# Patient Record
Sex: Male | Born: 1975 | Hispanic: Yes | Marital: Single | Smoking: Former smoker
Health system: Southern US, Community
[De-identification: ages and names within clinical notes are randomized; demographics above are authoritative.]

## PROBLEM LIST (undated history)

## (undated) DIAGNOSIS — F909 Attention-deficit hyperactivity disorder, unspecified type: Secondary | ICD-10-CM

## (undated) DIAGNOSIS — T148XXA Other injury of unspecified body region, initial encounter: Secondary | ICD-10-CM

## (undated) HISTORY — PX: ANTERIOR CRUCIATE LIGAMENT REPAIR: SHX115

---

## 1998-03-11 ENCOUNTER — Emergency Department (HOSPITAL_COMMUNITY): Admission: EM | Admit: 1998-03-11 | Discharge: 1998-03-11 | Payer: Self-pay | Admitting: Emergency Medicine

## 2012-01-09 ENCOUNTER — Emergency Department (HOSPITAL_COMMUNITY): Payer: Self-pay

## 2012-01-09 ENCOUNTER — Emergency Department (HOSPITAL_COMMUNITY)
Admission: EM | Admit: 2012-01-09 | Discharge: 2012-01-09 | Disposition: A | Discharge status: Home / Self Care | Payer: Self-pay | Attending: Emergency Medicine | Admitting: Emergency Medicine

## 2012-01-09 ENCOUNTER — Encounter (HOSPITAL_COMMUNITY): Payer: Self-pay | Admitting: Emergency Medicine

## 2012-01-09 DIAGNOSIS — Z23 Encounter for immunization: Secondary | ICD-10-CM | POA: Insufficient documentation

## 2012-01-09 DIAGNOSIS — S51809A Unspecified open wound of unspecified forearm, initial encounter: Secondary | ICD-10-CM | POA: Insufficient documentation

## 2012-01-09 DIAGNOSIS — W298XXA Contact with other powered powered hand tools and household machinery, initial encounter: Secondary | ICD-10-CM | POA: Insufficient documentation

## 2012-01-09 DIAGNOSIS — S51819A Laceration without foreign body of unspecified forearm, initial encounter: Secondary | ICD-10-CM

## 2012-01-09 DIAGNOSIS — Z1889 Other specified retained foreign body fragments: Secondary | ICD-10-CM | POA: Insufficient documentation

## 2012-01-09 MED ORDER — HYDROCODONE-ACETAMINOPHEN 5-325 MG PO TABS: ORAL_TABLET | ORAL | Status: DC

## 2012-01-09 MED ORDER — TETANUS-DIPHTH-ACELL PERTUSSIS 5-2.5-18.5 LF-MCG/0.5 IM SUSP
0.5000 mL | Freq: Once | INTRAMUSCULAR | Status: AC
  Administered 2012-01-09: 0.5 mL via INTRAMUSCULAR
  Filled 2012-01-09: qty 0.5

## 2012-01-09 NOTE — ED Provider Notes (Signed)
History     CSN: [id number]  Arrival date & time 01/09/12  1018   First MD Initiated Contact with Patient 01/09/12 1025      Chief Complaint  Patient presents with  . Extremity Laceration    (Consider location/radiation/quality/duration/timing/severity/associated sxs/prior treatment) HPI Comments: Patient presents with complaint of left forearm laceration sustained while he was working with a chain saw. Patient was cutting a limb off a tree limb when the chain saw struck his arm. Pressure was applied prior to arrival. No other treatments prior to arrival. Patient denies numbness, weakness, tingling of his left arm or hand. Onset was acute. Course is constant. Nothing makes symptoms better or worse. Tetanus is out of date.  Patient is a 36 y.o. male presenting with skin laceration. The history is provided by the patient.  Laceration     No past medical history on file.  No past surgical history on file.  No family history on file.  History  Substance Use Topics  . Smoking status: Not on file  . Smokeless tobacco: Not on file  . Alcohol Use: Not on file      Review of Systems  Constitutional: Negative for fever.  HENT: Negative for neck pain and neck stiffness.   Eyes: Negative for redness.  Respiratory: Negative for shortness of breath.   Cardiovascular: Negative for chest pain.  Gastrointestinal: Negative for nausea, vomiting, abdominal pain and diarrhea.  Musculoskeletal: Positive for myalgias.  Skin: Positive for wound. Negative for rash.  Neurological: Negative for headaches.  Psychiatric/Behavioral: Negative for self-injury.    Allergies  Review of patient's allergies indicates no known allergies.  Home Medications   Current Outpatient Rx  Name Route Sig Dispense Refill  . HYDROCODONE-ACETAMINOPHEN 5-325 MG PO TABS  Take 1-2 tablets every 6 hours as needed for severe pain 12 tablet 0    BP 115/64  Pulse 68  Temp 98.4 F (36.9 C) (Oral)  Resp 16   SpO2 97%  Physical Exam  Nursing note and vitals reviewed. Constitutional: He appears well-developed and well-nourished.  HENT:  Head: Normocephalic and atraumatic.  Eyes: Conjunctivae normal are normal. Right eye exhibits no discharge. Left eye exhibits no discharge.  Neck: Normal range of motion. Neck supple.  Cardiovascular: Normal rate, regular rhythm and normal heart sounds.   Pulses:      Radial pulses are 2+ on the right side, and 2+ on the left side.  Pulmonary/Chest: Effort normal and breath sounds normal.  Abdominal: Soft. There is no tenderness.  Musculoskeletal: He exhibits tenderness.       Capillary refill less than 2 seconds in all fingers of left hand. Patient has full range of motion in left fingers, hand, wrist, elbow, and shoulder.  Neurological: He is alert.       Sensation intact distal to the wound.  Skin: Skin is warm and dry.       Two parallel linear full-thickness wounds of left dorsum of forearm extending into the muscle at places. The more proximal wound is 7 cm in length and the distal wound is 5 cm in length. Small amount of debris noted but wound is relatively clean.  Psychiatric: He has a normal mood and affect.    ED Course  Procedures (including critical care time)  Labs Reviewed - No data to display Dg Forearm Left  01/09/2012  *RADIOLOGY REPORT*  Clinical Data: Left forearm laceration by chain saw  LEFT FOREARM - 2 VIEW  Comparison: None  Findings: Soft tissue  irregularity along the radial margin of the mid left forearm. Osseous mineralization normal. Joint spaces preserved. Nonfused ossicle at tip of the ulnar styloid process. Small olecranon spur. No acute fracture, dislocation, or bone destruction.  IMPRESSION: No acute osseous abnormalities.   Original Report Authenticated By: Lollie Marrow, M.D.      1. Laceration of forearm     10:43 AM Patient seen and examined. Blood pressure cuff used as tourniquet to control bleeding. I deflated it to  and identifed some venous oozing. Lacerations anesthetized with 10cc 2% lidocaine with epinephrine. Deep horizontal mattress stitch was made using 4-0 Vicryl to obtain hemostasis. BP cuff released. 2+ radial pulse. Patient with full ROM in all fingers, hand, wrist, elbow. X-ray, tetanus ordered. Will need to clean wound and repair.   Vital signs reviewed and are as follows: Filed Vitals:   01/09/12 1044  BP: 115/64  Pulse: 68  Temp: 98.4 F (36.9 C)  Resp: 16   11:43 AM X-ray results reviewed.   Patient informed of x-ray results.  Patient rinsed the wound under warm tap water for 5 minutes. The wound was then further cleaned with approximately 500 cc of normal saline under pressure with syringe and splash guard. Patient tolerated this well. No further debris visualized inside the wound. Wound fully explored. Base fully visualized. No significant tendon, muscle, or nerve injury visualized.  LACERATION REPAIR Performed by: Carolee Rota Authorized by: Carolee Rota Consent: Verbal consent obtained. Risks and benefits: risks, benefits and alternatives were discussed Consent given by: patient Patient identity confirmed: provided demographic data Prepped and Draped in normal sterile fashion Wound explored  Laceration Location: L forearm  Laceration Length: 4cm  No Foreign Bodies seen or palpated  Anesthesia: local infiltration  Local anesthetic: lidocaine 2% with epinephrine  Anesthetic total: 4 ml  Irrigation method: bulk flow, syringe with splash guard  Amount of cleaning: copious  Skin closure: 4-0 Ethilon  Number of sutures: 8  Technique: locked running  Patient tolerance: Patient tolerated the procedure well with no immediate complications.   LACERATION REPAIR Performed by: Carolee Rota Authorized by: Carolee Rota Consent: Verbal consent obtained. Risks and benefits: risks, benefits and alternatives were discussed Consent given by:  patient Patient identity confirmed: provided demographic data Prepped and Draped in normal sterile fashion Wound explored  Laceration Location: left forearm  Laceration Length: 6cm  No Foreign Bodies seen or palpated  Anesthesia: local infiltration  Local anesthetic: lidocaine 2% with epinephrine  Anesthetic total: 6 ml  Irrigation method: bulk flow with tap water, syringe with splash guard  Amount of cleaning: copious  Skin closure: 4-0 Ethilon  Number of sutures: 10  Technique: simple interrupted  Patient tolerance: Patient tolerated the procedure well with no immediate complications.  Patient counseled on wound care.   The patient was urged to return to the Emergency Department urgently with worsening pain, swelling, expanding erythema especially if it streaks away from the affected area, fever, or if they have any other concerns. Patient verbalized understanding.   Patient counseled on use of narcotic pain medications. Counseled not to combine these medications with others containing tylenol. Urged not to drink alcohol, drive, or perform any other activities that requires focus while taking these medications. The patient verbalizes understanding and agrees with the plan.  Patient counseled on need to return for suture removal in 10 days.   MDM  Patient was laceration of left forearm due to chainsaw. X-ray negative. Tetanus updated. Wound cleaned well with copious amounts  of water. Repaired without incident. Do not suspect significant neurovascular injury. Do not suspect tendon injury.        Renne Crigler, [place] 01/09/12 1749

## 2012-01-09 NOTE — ED Notes (Signed)
To ED via POV for eval after lacerating left forearm with chain saw. Actively bleeding on arrival. Brought straight to ED room and automatic tourniquet placed with desired results. ED staff and providers immediately to bedside.

## 2012-01-09 NOTE — ED Provider Notes (Addendum)
Patient suffered laceration to dorsum of left forearm as result of cutting wood with a chain saw.. Hand , wrist with full range of motion radial pulse 2+  Doug Sou, MD 01/09/12 1153  Doug Sou, MD 01/09/12 1153

## 2012-01-10 NOTE — ED Provider Notes (Signed)
Medical screening examination/treatment/procedure(s) were conducted as a shared visit with non-physician practitioner(s) and myself.  I personally evaluated the patient during the encounter  Doug Sou, MD 01/10/12 [phone]

## 2018-08-22 ENCOUNTER — Encounter (HOSPITAL_COMMUNITY): Payer: Self-pay | Admitting: Emergency Medicine

## 2018-08-22 ENCOUNTER — Emergency Department (HOSPITAL_COMMUNITY): Payer: No Typology Code available for payment source

## 2018-08-22 ENCOUNTER — Inpatient Hospital Stay (HOSPITAL_COMMUNITY)
Admission: EM | Admit: 2018-08-22 | Discharge: 2018-08-24 | DRG: 183 | Disposition: A | Discharge status: Home / Self Care | Payer: No Typology Code available for payment source | Attending: Surgery | Admitting: Surgery

## 2018-08-22 ENCOUNTER — Other Ambulatory Visit: Payer: Self-pay

## 2018-08-22 DIAGNOSIS — Z1159 Encounter for screening for other viral diseases: Secondary | ICD-10-CM

## 2018-08-22 DIAGNOSIS — S2241XA Multiple fractures of ribs, right side, initial encounter for closed fracture: Secondary | ICD-10-CM | POA: Diagnosis present

## 2018-08-22 DIAGNOSIS — S22019A Unspecified fracture of first thoracic vertebra, initial encounter for closed fracture: Secondary | ICD-10-CM | POA: Diagnosis present

## 2018-08-22 DIAGNOSIS — S27321A Contusion of lung, unilateral, initial encounter: Secondary | ICD-10-CM | POA: Diagnosis present

## 2018-08-22 DIAGNOSIS — S42111A Displaced fracture of body of scapula, right shoulder, initial encounter for closed fracture: Secondary | ICD-10-CM | POA: Diagnosis present

## 2018-08-22 DIAGNOSIS — S12600A Unspecified displaced fracture of seventh cervical vertebra, initial encounter for closed fracture: Secondary | ICD-10-CM | POA: Diagnosis present

## 2018-08-22 DIAGNOSIS — S272XXA Traumatic hemopneumothorax, initial encounter: Secondary | ICD-10-CM | POA: Diagnosis present

## 2018-08-22 DIAGNOSIS — T148XXA Other injury of unspecified body region, initial encounter: Secondary | ICD-10-CM

## 2018-08-22 DIAGNOSIS — J942 Hemothorax: Secondary | ICD-10-CM

## 2018-08-22 DIAGNOSIS — D62 Acute posthemorrhagic anemia: Secondary | ICD-10-CM | POA: Diagnosis present

## 2018-08-22 DIAGNOSIS — Y92410 Unspecified street and highway as the place of occurrence of the external cause: Secondary | ICD-10-CM | POA: Diagnosis not present

## 2018-08-22 DIAGNOSIS — S42101A Fracture of unspecified part of scapula, right shoulder, initial encounter for closed fracture: Secondary | ICD-10-CM

## 2018-08-22 DIAGNOSIS — M79662 Pain in left lower leg: Secondary | ICD-10-CM | POA: Diagnosis present

## 2018-08-22 DIAGNOSIS — J939 Pneumothorax, unspecified: Secondary | ICD-10-CM

## 2018-08-22 DIAGNOSIS — S82852A Displaced trimalleolar fracture of left lower leg, initial encounter for closed fracture: Secondary | ICD-10-CM | POA: Diagnosis present

## 2018-08-22 DIAGNOSIS — S270XXA Traumatic pneumothorax, initial encounter: Secondary | ICD-10-CM

## 2018-08-22 LAB — COMPREHENSIVE METABOLIC PANEL
ALT: 29 U/L (ref 0–44)
AST: 38 U/L (ref 15–41)
Albumin: 4.2 g/dL (ref 3.5–5.0)
Alkaline Phosphatase: 66 U/L (ref 38–126)
Anion gap: 9 (ref 5–15)
BUN: 14 mg/dL (ref 6–20)
CO2: 25 mmol/L (ref 22–32)
Calcium: 8.8 mg/dL — ABNORMAL LOW (ref 8.9–10.3)
Chloride: 106 mmol/L (ref 98–111)
Creatinine, Ser: 0.98 mg/dL (ref 0.61–1.24)
GFR calc Af Amer: >60 mL/min (ref >60)
GFR calc non Af Amer: >60 mL/min (ref >60)
Glucose, Bld: 127 mg/dL — ABNORMAL HIGH (ref 70–99)
Potassium: 3.4 mmol/L — ABNORMAL LOW (ref 3.5–5.1)
Sodium: 140 mmol/L (ref 135–145)
Total Bilirubin: 0.7 mg/dL (ref 0.3–1.2)
Total Protein: 6.8 g/dL (ref 6.5–8.1)

## 2018-08-22 LAB — PROTIME-INR
INR: 1 (ref 0.8–1.2)
Prothrombin Time: 13.5 seconds (ref 11.4–15.2)

## 2018-08-22 LAB — URINALYSIS, ROUTINE W REFLEX MICROSCOPIC
Bacteria, UA: NONE SEEN
Bilirubin Urine: NEGATIVE
Glucose, UA: NEGATIVE mg/dL
Ketones, ur: NEGATIVE mg/dL
Leukocytes,Ua: NEGATIVE
Nitrite: NEGATIVE
Protein, ur: NEGATIVE mg/dL
Specific Gravity, Urine: >1.046 — ABNORMAL HIGH (ref 1.005–1.030)
pH: 5 (ref 5.0–8.0)

## 2018-08-22 LAB — SAMPLE TO BLOOD BANK

## 2018-08-22 LAB — LACTIC ACID, PLASMA
Lactic Acid, Venous: 2.2 mmol/L (ref 0.5–1.9)
Lactic Acid, Venous: 1.8 mmol/L (ref 0.5–1.9)

## 2018-08-22 LAB — CBC
HCT: 43.6 % (ref 39.0–52.0)
Hemoglobin: 14.8 g/dL (ref 13.0–17.0)
MCH: 31.8 pg (ref 26.0–34.0)
MCHC: 33.9 g/dL (ref 30.0–36.0)
MCV: 93.8 fL (ref 80.0–100.0)
Platelets: 225 10*3/uL (ref 150–400)
RBC: 4.65 MIL/uL (ref 4.22–5.81)
RDW: 13 % (ref 11.5–15.5)
WBC: 11.2 10*3/uL — ABNORMAL HIGH (ref 4.0–10.5)
nRBC: 0 % (ref 0.0–0.2)

## 2018-08-22 LAB — SARS CORONAVIRUS 2 BY RT PCR (HOSPITAL ORDER, PERFORMED IN ~~LOC~~ HOSPITAL LAB): SARS Coronavirus 2: NEGATIVE

## 2018-08-22 LAB — ETHANOL: Alcohol, Ethyl (B): <10 mg/dL (ref <10)

## 2018-08-22 MED ORDER — HYDROMORPHONE HCL 1 MG/ML IJ SOLN: 0.5000 mg | INTRAMUSCULAR | Status: DC | PRN

## 2018-08-22 MED ORDER — HYDROMORPHONE HCL 1 MG/ML IJ SOLN
1.0000 mg | Freq: Once | INTRAMUSCULAR | Status: AC
  Administered 2018-08-22: 1 mg via INTRAVENOUS
  Filled 2018-08-22: qty 1

## 2018-08-22 MED ORDER — SODIUM CHLORIDE 0.9 % IV BOLUS
1000.0000 mL | Freq: Once | INTRAVENOUS | Status: AC
  Administered 2018-08-22: 1000 mL via INTRAVENOUS

## 2018-08-22 MED ORDER — ACETAMINOPHEN 325 MG PO TABS
650.0000 mg | ORAL_TABLET | Freq: Four times a day (QID) | ORAL | Status: DC
  Administered 2018-08-22 – 2018-08-24 (×7): 650 mg via ORAL
  Filled 2018-08-22 (×7): qty 2

## 2018-08-22 MED ORDER — ONDANSETRON HCL 4 MG/2ML IJ SOLN: 4.0000 mg | Freq: Four times a day (QID) | INTRAMUSCULAR | Status: DC | PRN

## 2018-08-22 MED ORDER — GABAPENTIN 300 MG PO CAPS
300.0000 mg | ORAL_CAPSULE | Freq: Three times a day (TID) | ORAL | Status: DC
  Administered 2018-08-22 – 2018-08-24 (×6): 300 mg via ORAL
  Filled 2018-08-22 (×6): qty 1

## 2018-08-22 MED ORDER — DOCUSATE SODIUM 100 MG PO CAPS
200.0000 mg | ORAL_CAPSULE | Freq: Two times a day (BID) | ORAL | Status: DC
  Administered 2018-08-22 – 2018-08-24 (×3): 200 mg via ORAL
  Filled 2018-08-22 (×4): qty 2

## 2018-08-22 MED ORDER — PROPOFOL 10 MG/ML IV BOLUS
1.0000 mg/kg | Freq: Once | INTRAVENOUS | Status: DC
  Filled 2018-08-22: qty 20

## 2018-08-22 MED ORDER — IOHEXOL 300 MG/ML  SOLN
100.0000 mL | Freq: Once | INTRAMUSCULAR | Status: AC | PRN
  Administered 2018-08-22: 100 mL via INTRAVENOUS

## 2018-08-22 MED ORDER — ONDANSETRON HCL 4 MG/2ML IJ SOLN
4.0000 mg | Freq: Once | INTRAMUSCULAR | Status: DC | PRN
  Filled 2018-08-22: qty 2

## 2018-08-22 MED ORDER — PROPOFOL 10 MG/ML IV BOLUS
INTRAVENOUS | Status: AC | PRN
  Administered 2018-08-22: 70 mg via INTRAVENOUS
  Administered 2018-08-22 (×2): 30 mg via INTRAVENOUS

## 2018-08-22 MED ORDER — IBUPROFEN 600 MG PO TABS: 600.0000 mg | ORAL_TABLET | Freq: Four times a day (QID) | ORAL | Status: DC | PRN

## 2018-08-22 MED ORDER — OXYCODONE HCL 5 MG PO TABS: 5.0000 mg | ORAL_TABLET | Freq: Four times a day (QID) | ORAL | Status: DC | PRN

## 2018-08-22 MED ORDER — ONDANSETRON 4 MG PO TBDP: 4.0000 mg | ORAL_TABLET | Freq: Four times a day (QID) | ORAL | Status: DC | PRN

## 2018-08-22 MED ORDER — LACTATED RINGERS IV SOLN
INTRAVENOUS | Status: DC
  Administered 2018-08-22 – 2018-08-23 (×2): via INTRAVENOUS

## 2018-08-22 MED ORDER — HYDROMORPHONE HCL 1 MG/ML IJ SOLN
1.0000 mg | INTRAMUSCULAR | Status: DC | PRN
  Administered 2018-08-22: 1 mg via INTRAVENOUS
  Filled 2018-08-22: qty 1

## 2018-08-22 NOTE — ED Notes (Signed)
Friend came by to check on patient. She was told no visitors due to COVID-19. Friend left her number: Victorino Dike [phone]
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
## 2018-08-22 NOTE — Sedation Documentation (Signed)
Patient transported to CT 
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
## 2018-08-22 NOTE — ED Notes (Signed)
Aspen collar applied
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
## 2018-08-22 NOTE — ED Provider Notes (Addendum)
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    ED Discharge Orders    None       Pricilla LovelessGoldston, Damarrion Mimbs, MD 08/22/18 Tonie Griffith1954    Pricilla LovelessGoldston, Tyriq Moragne, MD 08/22/18 [phone]

## 2018-08-23 ENCOUNTER — Encounter (HOSPITAL_COMMUNITY): Payer: Self-pay | Admitting: *Deleted

## 2018-08-23 ENCOUNTER — Inpatient Hospital Stay (HOSPITAL_COMMUNITY): Payer: No Typology Code available for payment source

## 2018-08-23 LAB — BASIC METABOLIC PANEL
Anion gap: 10 (ref 5–15)
BUN: 12 mg/dL (ref 6–20)
CO2: 25 mmol/L (ref 22–32)
Calcium: 8.6 mg/dL — ABNORMAL LOW (ref 8.9–10.3)
Chloride: 103 mmol/L (ref 98–111)
Creatinine, Ser: 0.95 mg/dL (ref 0.61–1.24)
GFR calc Af Amer: >60 mL/min (ref >60)
GFR calc non Af Amer: >60 mL/min (ref >60)
Glucose, Bld: 119 mg/dL — ABNORMAL HIGH (ref 70–99)
Potassium: 4 mmol/L (ref 3.5–5.1)
Sodium: 138 mmol/L (ref 135–145)

## 2018-08-23 LAB — CBC
HCT: 41.1 % (ref 39.0–52.0)
Hemoglobin: 14 g/dL (ref 13.0–17.0)
MCH: 31.7 pg (ref 26.0–34.0)
MCHC: 34.1 g/dL (ref 30.0–36.0)
MCV: 93.2 fL (ref 80.0–100.0)
Platelets: 211 10*3/uL (ref 150–400)
RBC: 4.41 MIL/uL (ref 4.22–5.81)
RDW: 13.2 % (ref 11.5–15.5)
WBC: 13.6 10*3/uL — ABNORMAL HIGH (ref 4.0–10.5)
nRBC: 0 % (ref 0.0–0.2)

## 2018-08-23 LAB — HIV ANTIBODY (ROUTINE TESTING W REFLEX): HIV Screen 4th Generation wRfx: NONREACTIVE

## 2018-08-23 LAB — CDS SEROLOGY

## 2018-08-23 MED ORDER — METHOCARBAMOL 500 MG PO TABS: 500.0000 mg | ORAL_TABLET | Freq: Three times a day (TID) | ORAL | Status: DC

## 2018-08-23 MED ORDER — LIDOCAINE-EPINEPHRINE 1 %-1:100000 IJ SOLN
20.0000 mL | INTRAMUSCULAR | Status: AC
  Filled 2018-08-23: qty 20

## 2018-08-23 MED ORDER — FENTANYL CITRATE (PF) 100 MCG/2ML IJ SOLN
INTRAMUSCULAR | Status: AC
  Administered 2018-08-23: 50 ug
  Filled 2018-08-23: qty 2

## 2018-08-23 MED ORDER — KETOROLAC TROMETHAMINE 15 MG/ML IJ SOLN
15.0000 mg | Freq: Three times a day (TID) | INTRAMUSCULAR | Status: DC
  Administered 2018-08-23 – 2018-08-24 (×4): 15 mg via INTRAVENOUS
  Filled 2018-08-23 (×4): qty 1

## 2018-08-23 MED ORDER — VITAMIN C 500 MG PO TABS
500.0000 mg | ORAL_TABLET | Freq: Every day | ORAL | Status: DC
  Administered 2018-08-23 – 2018-08-24 (×2): 500 mg via ORAL
  Filled 2018-08-23 (×2): qty 1

## 2018-08-23 MED ORDER — ENOXAPARIN SODIUM 40 MG/0.4ML ~~LOC~~ SOLN
40.0000 mg | SUBCUTANEOUS | Status: DC
  Administered 2018-08-23 – 2018-08-24 (×2): 40 mg via SUBCUTANEOUS
  Filled 2018-08-23 (×2): qty 0.4

## 2018-08-23 MED ORDER — FENTANYL CITRATE (PF) 100 MCG/2ML IJ SOLN: 50.0000 ug | Freq: Once | INTRAMUSCULAR | Status: DC

## 2018-08-23 MED ORDER — METHOCARBAMOL 500 MG PO TABS: 500.0000 mg | ORAL_TABLET | Freq: Three times a day (TID) | ORAL | Status: DC | PRN
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
## 2018-08-23 NOTE — Progress Notes (Signed)
Orthopaedic Trauma Service   Pt seen and evaluated  Full consult to follow  Persistent subluxation of L trimalleolar ankle fracture dislocation with syndesmotic disruption  Marked swelling present Attempt re-reduction at bedside   Pt was in a orthoglass splint. Did not appreciate any mold on it  If re-reduction unsuccessful will proceed to OR for ex fix  Anticipate formal ORIF in 10-14 days Pt with superficial abrasion over medial malleolus  Nondisplaced R scapular body fracture--> WBAT, no restrictions  Multiple R rib fractures--> pulmonary toilet and pain control  Mearl Latin, PA-C [phone] (C) 08/23/2018, 10:29 AM  [facility] [address] [place] [address] [phone] Collier Bullock (F)
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
## 2018-08-23 NOTE — Plan of Care (Signed)
  Problem: Clinical Measurements: Goal: Ability to maintain clinical measurements within normal limits will improve Outcome: Progressing   Problem: Activity: Goal: Risk for activity intolerance will decrease Outcome: Progressing   Problem: Coping: Goal: Level of anxiety will decrease Outcome: Progressing   Problem: Elimination: Goal: Will not experience complications related to bowel motility Outcome: Progressing   Problem: Pain Managment: Goal: General experience of comfort will improve Outcome: Progressing   Problem: Safety: Goal: Ability to remain free from injury will improve Outcome: Progressing   
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
## 2018-08-24 LAB — CBC
HCT: 37.8 % — ABNORMAL LOW (ref 39.0–52.0)
Hemoglobin: 13 g/dL (ref 13.0–17.0)
MCH: 31.6 pg (ref 26.0–34.0)
MCHC: 34.4 g/dL (ref 30.0–36.0)
MCV: 92 fL (ref 80.0–100.0)
Platelets: 179 10*3/uL (ref 150–400)
RBC: 4.11 MIL/uL — ABNORMAL LOW (ref 4.22–5.81)
RDW: 13 % (ref 11.5–15.5)
WBC: 10.5 10*3/uL (ref 4.0–10.5)
nRBC: 0 % (ref 0.0–0.2)

## 2018-08-24 LAB — BASIC METABOLIC PANEL
Anion gap: 10 (ref 5–15)
BUN: 10 mg/dL (ref 6–20)
CO2: 24 mmol/L (ref 22–32)
Calcium: 8.6 mg/dL — ABNORMAL LOW (ref 8.9–10.3)
Chloride: 102 mmol/L (ref 98–111)
Creatinine, Ser: 0.88 mg/dL (ref 0.61–1.24)
GFR calc Af Amer: >60 mL/min (ref >60)
GFR calc non Af Amer: >60 mL/min (ref >60)
Glucose, Bld: 110 mg/dL — ABNORMAL HIGH (ref 70–99)
Potassium: 3.9 mmol/L (ref 3.5–5.1)
Sodium: 136 mmol/L (ref 135–145)

## 2018-08-24 MED ORDER — ONDANSETRON 4 MG PO TBDP: 4.0000 mg | ORAL_TABLET | Freq: Four times a day (QID) | ORAL | 0 refills | Status: DC | PRN

## 2018-08-24 MED ORDER — ASCORBIC ACID 500 MG PO TABS: 500.0000 mg | ORAL_TABLET | Freq: Every day | ORAL | 2 refills | Status: DC

## 2018-08-24 MED ORDER — ENOXAPARIN SODIUM 40 MG/0.4ML ~~LOC~~ SOLN: 40.0000 mg | SUBCUTANEOUS | 0 refills | Status: DC

## 2018-08-24 MED ORDER — OXYCODONE-ACETAMINOPHEN 5-325 MG PO TABS: 1.0000 | ORAL_TABLET | Freq: Four times a day (QID) | ORAL | 0 refills | Status: DC | PRN

## 2018-08-24 MED ORDER — GABAPENTIN 300 MG PO CAPS: 300.0000 mg | ORAL_CAPSULE | Freq: Two times a day (BID) | ORAL | 0 refills | Status: DC

## 2018-08-24 MED ORDER — ACETAMINOPHEN 325 MG PO TABS: 500.0000 mg | ORAL_TABLET | Freq: Two times a day (BID) | ORAL | 0 refills | Status: DC

## 2018-08-24 MED ORDER — METHOCARBAMOL 500 MG PO TABS: 500.0000 mg | ORAL_TABLET | Freq: Three times a day (TID) | ORAL | 0 refills | Status: DC | PRN

## 2018-08-24 MED ORDER — ACETAMINOPHEN 500 MG PO TABS: 500.0000 mg | ORAL_TABLET | Freq: Three times a day (TID) | ORAL | 0 refills | Status: DC

## 2018-08-24 MED ORDER — DOCUSATE SODIUM 100 MG PO CAPS: 100.0000 mg | ORAL_CAPSULE | Freq: Two times a day (BID) | ORAL | 1 refills | Status: DC

## 2018-08-24 MED ORDER — GABAPENTIN 300 MG PO CAPS: 300.0000 mg | ORAL_CAPSULE | Freq: Two times a day (BID) | ORAL | 0 refills | Status: AC

## 2018-08-24 MED ORDER — OXYCODONE-ACETAMINOPHEN 5-325 MG PO TABS: 1.0000 | ORAL_TABLET | Freq: Three times a day (TID) | ORAL | 0 refills | Status: DC | PRN

## 2018-08-24 MED FILL — ONDANSETRON ODT 4 MG TABLET: 4 | 5 days supply | Qty: 20 | Fill #0

## 2018-08-24 MED FILL — METHOCARBAMOL 500 MG TABLET: 500 | 20 days supply | Qty: 60 | Fill #0

## 2018-08-24 MED FILL — ENOXAPARIN 40 MG/0.4 ML SYR: 40 | 30 days supply | Qty: 12 | Fill #0

## 2018-08-24 MED FILL — OXYCODONE W/APAP 5/325 TAB: 5-325 | 5 days supply | Qty: 30 | Fill #0

## 2018-08-24 MED FILL — GABAPENTIN 300 MG CAPSULE: 300 | 30 days supply | Qty: 60 | Fill #0
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
## 2018-08-24 NOTE — Discharge Instructions (Signed)
Orthopaedic Trauma Service Discharge Instructions   General Discharge Instructions  WEIGHT BEARING STATUS: Nonweightbearing Left leg   RANGE OF MOTION/ACTIVITY: ok to move toes and knee on left leg.  Activity as tolerated while maintaining nonweightbearing on left leg. However, try to keep leg elevated above heart as much as possible to help with swelling control   Bone health:  These labs are being processed, we can discuss at your follow up visit   Wound Care: do not remove splint, do not get split wet. You can buy "cast covers" at the drug store such as [facility] or [facility] so you can take a shower.  These can also be purchased online   DVT/PE prophylaxis: Lovenox 1 injection daily x 4 weeks   Diet: as you were eating previously.  Can use over the counter stool softeners and bowel preparations, such as Miralax, to help with bowel movements.  Narcotics can be constipating.  Be sure to drink plenty of fluids  PAIN MEDICATION USE AND EXPECTATIONS  You have likely been given narcotic medications to help control your pain.  After a traumatic event that results in an fracture (broken bone) with or without surgery, it is ok to use narcotic pain medications to help control one's pain.  We understand that everyone responds to pain differently and each individual patient will be evaluated on a regular basis for the continued need for narcotic medications. Ideally, narcotic medication use should last no more than 6-8 weeks (coinciding with fracture healing).   As a patient it is your responsibility as well to monitor narcotic medication use and report the amount and frequency you use these medications when you come to your office visit.   We would also advise that if you are using narcotic medications, you should take a dose prior to therapy to maximize you participation.  IF YOU ARE ON NARCOTIC MEDICATIONS IT IS NOT PERMISSIBLE TO OPERATE A MOTOR VEHICLE (MOTORCYCLE/CAR/TRUCK/MOPED) OR HEAVY  MACHINERY DO NOT MIX NARCOTICS WITH OTHER CNS (CENTRAL NERVOUS SYSTEM) DEPRESSANTS SUCH AS ALCOHOL   STOP SMOKING OR USING NICOTINE PRODUCTS!!!!  As discussed nicotine severely impairs your body's ability to heal surgical and traumatic wounds but also impairs bone healing.  Wounds and bone heal by forming microscopic blood vessels (angiogenesis) and nicotine is a vasoconstrictor (essentially, shrinks blood vessels).  Therefore, if vasoconstriction occurs to these microscopic blood vessels they essentially disappear and are unable to deliver necessary nutrients to the healing tissue.  This is one modifiable factor that you can do to dramatically increase your chances of healing your injury.    (This means no smoking, no nicotine gum, patches, etc)  DO NOT USE NONSTEROIDAL ANTI-INFLAMMATORY DRUGS (NSAID'S)  Using products such as Advil (ibuprofen), Aleve (naproxen), Motrin (ibuprofen) for additional pain control during fracture healing can delay and/or prevent the healing response.  If you would like to take over the counter (OTC) medication, Tylenol (acetaminophen) is ok.  However, some narcotic medications that are given for pain control contain acetaminophen as well. Therefore, you should not exceed more than 4000 mg of tylenol in a day if you do not have liver disease.  Also note that there are may OTC medicines, such as cold medicines and allergy medicines that my contain tylenol as well.  If you have any questions about medications and/or interactions please ask your doctor/PA or your pharmacist.      ICE AND ELEVATE INJURED/OPERATIVE EXTREMITY  Using ice and elevating the injured extremity above your heart can  help with swelling and pain control.  Icing in a pulsatile fashion, such as 20 minutes on and 20 minutes off, can be followed.    Do not place ice directly on skin. Make sure there is a barrier between to skin and the ice pack.    Using frozen items such as frozen peas works well as the  conform nicely to the are that needs to be iced.  USE AN ACE WRAP OR TED HOSE FOR SWELLING CONTROL  In addition to icing and elevation, Ace wraps or TED hose are used to help limit and resolve swelling.  It is recommended to use Ace wraps or TED hose until you are informed to stop.    When using Ace Wraps start the wrapping distally (farthest away from the body) and wrap proximally (closer to the body)   Example: If you had surgery on your leg or thing and you do not have a splint on, start the ace wrap at the toes and work your way up to the thigh        If you had surgery on your upper extremity and do not have a splint on, start the ace wrap at your fingers and work your way up to the upper arm  IF YOU ARE IN A SPLINT OR CAST DO NOT REMOVE IT FOR ANY REASON   If your splint gets wet for any reason please contact the office immediately. You may shower in your splint or cast as long as you keep it dry.  This can be done by wrapping in a cast cover or garbage back (or similar)  Do Not stick any thing down your splint or cast such as pencils, money, or hangers to try and scratch yourself with.  If you feel itchy take benadryl as prescribed on the bottle for itching  IF YOU ARE IN A CAM BOOT (BLACK BOOT)  You may remove boot periodically. Perform daily dressing changes as noted below.  Wash the liner of the boot regularly and wear a sock when wearing the boot. It is recommended that you sleep in the boot until told otherwise  CALL THE OFFICE WITH ANY QUESTIONS OR CONCERNS: [phone]     Cast or Splint Care, Adult Casts and splints are supports that are worn to protect broken bones and other injuries. A cast or splint may hold a bone still and in the correct position while it heals. Casts and splints may also help to ease pain, swelling, and muscle spasms. How to care for your cast   Do not stick anything inside the cast to scratch your skin.  Check the skin around the cast every day. Tell  your doctor about any concerns.  You may put lotion on dry skin around the edges of the cast. Do not put lotion on the skin under the cast.  Keep the cast clean.  If the cast is not waterproof: ? Do not let it get wet. ? Cover it with a watertight covering when you take a bath or a shower. How to care for your splint   Wear it as told by your doctor. Take it off only as told by your doctor.  Loosen the splint if your fingers or toes tingle, get numb, or turn cold and blue.  Keep the splint clean.  If the splint is not waterproof: ? Do not let it get wet. ? Cover it with a watertight covering when you take a bath or a shower. Follow these  instructions at home: Bathing  Do not take baths or swim until your doctor says it is okay. Ask your doctor if you can take showers. You may only be allowed to take sponge baths for bathing.  If your cast or splint is not waterproof, cover it with a watertight covering when you take a bath or shower. Managing pain, stiffness, and swelling  Move your fingers or toes often to avoid stiffness and to lessen swelling.  Raise (elevate) the injured area above the level of your heart while sitting or lying down. Safety  Do not use the injured limb to support your body weight until your doctor says that it is okay.  Use crutches or other assistive devices as told by your doctor. General instructions  Do not put pressure on any part of the cast or splint until it is fully hardened. This may take many hours.  Return to your normal activities as told by your doctor. Ask your doctor what activities are safe for you.  Keep all follow-up visits as told by your doctor. This is important. Contact a doctor if:  Your cast or splint gets damaged.  The skin around the cast gets red or raw.  The skin under the cast is very itchy or painful.  Your cast or splint feels very uncomfortable.  Your cast or splint is too tight or too loose.  Your cast  becomes wet or it starts to have a soft spot or area.  You get an object stuck under your cast. Get help right away if:  Your pain gets worse.  The injured area tingles, gets numb, or turns blue and cold.  The part of your body above or below the cast is swollen and it turns a different color (is discolored).  You cannot feel or move your fingers or toes.  There is fluid leaking through the cast.  You have very bad pain or pressure under the cast.  You have trouble breathing.  You have shortness of breath.  You have chest pain. This information is not intended to replace advice given to you by your health care provider. Make sure you discuss any questions you have with your health care provider. Document Released: 08/07/2010 Document Revised: 03/28/2016 Document Reviewed: 03/28/2016 Elsevier Interactive Patient Education  2019 [employer]
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
## 2018-08-24 NOTE — Progress Notes (Signed)
Provided discharge teaching to patient. Answered all questions and concerns. Pt not in distress and is to be discharged home with all belongings.
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
## 2018-08-24 NOTE — Plan of Care (Signed)

## 2018-08-25 LAB — VITAMIN D 25 HYDROXY (VIT D DEFICIENCY, FRACTURES): Vit D, 25-Hydroxy: 17.8 ng/mL — ABNORMAL LOW (ref 30.0–100.0)

## 2018-09-01 ENCOUNTER — Other Ambulatory Visit: Payer: Self-pay

## 2018-09-01 ENCOUNTER — Other Ambulatory Visit (HOSPITAL_COMMUNITY)
Admission: RE | Admit: 2018-09-01 | Discharge: 2018-09-01 | Disposition: A | Discharge status: Home / Self Care | Payer: HRSA Program | Source: Ambulatory Visit | Attending: Orthopedic Surgery | Admitting: Orthopedic Surgery

## 2018-09-01 ENCOUNTER — Encounter (HOSPITAL_COMMUNITY): Payer: Self-pay | Admitting: *Deleted

## 2018-09-01 DIAGNOSIS — Z1159 Encounter for screening for other viral diseases: Secondary | ICD-10-CM | POA: Diagnosis present

## 2018-09-01 LAB — SARS CORONAVIRUS 2 BY RT PCR (HOSPITAL ORDER, PERFORMED IN ~~LOC~~ HOSPITAL LAB): SARS Coronavirus 2: NEGATIVE

## 2018-09-01 NOTE — Progress Notes (Signed)
Pt denies SOB, chest pain, and being under the care of a cardiologist. Pt denies having a stress test, echo and cardiac cath. Pt denies having labs outside of COVID-19 Test on 09/01/2018.  Pt made aware to stop taking Aspirin (unless otherwise advised by surgeon), vitamins, fish oil and herbal medications. Do not take any NSAIDs ie: Ibuprofen, Advil, Naproxen (Aleve), Motrin, BC and Goody Powder. Pt stated that he was not instructed to stop Lovenox (taken at 8:00A.M. today). Pt denies that family members have tested positive for COVID-19.   Coronavirus Screening  Pt denies that he and family members experienced the following symptoms:  Cough yes/no: No Fever (>100.39F)  yes/no: No Runny nose yes/no: No Sore throat yes/no: No Difficulty breathing/shortness of breath  yes/no: No  Have you or a family member traveled in the last 14 days and where? yes/no: No  Pt reminded that hospital visitation restrictions are in effect and the importance of the restrictions.  Pt verbalized understanding of all pre-op instructions.

## 2018-09-01 NOTE — Anesthesia Preprocedure Evaluation (Addendum)
Anesthesia Evaluation  Patient identified by MRN, date of birth, ID band Patient awake    Reviewed: Allergy & Precautions, NPO status , Patient's Chart, lab work & pertinent test results  Airway Mallampati: II  TM Distance: >3 FB Neck ROM: Full    Dental  (+) Teeth Intact, Dental Advisory Given, Missing   Pulmonary former smoker,    Pulmonary exam normal breath sounds clear to auscultation       Cardiovascular negative cardio ROS Normal cardiovascular exam Rhythm:Regular Rate:Normal     Neuro/Psych negative neurological ROS     GI/Hepatic negative GI ROS, (+)     substance abuse  marijuana use,   Endo/Other  negative endocrine ROS  Renal/GU negative Renal ROS     Musculoskeletal LEFT ANKLE FRACTURE   Abdominal   Peds  (+) ADHD Hematology negative hematology ROS (+)   Anesthesia Other Findings Day of surgery medications reviewed with the patient.  Reproductive/Obstetrics                           Anesthesia Physical Anesthesia Plan  ASA: II  Anesthesia Plan: General   Post-op Pain Management:    Induction: Intravenous  PONV Risk Score and Plan: 2 and Dexamethasone, Ondansetron and Midazolam  Airway Management Planned: Oral ETT  Additional Equipment:   Intra-op Plan:   Post-operative Plan: Extubation in OR  Informed Consent: I have reviewed the patients History and Physical, chart, labs and discussed the procedure including the risks, benefits and alternatives for the proposed anesthesia with the patient or authorized representative who has indicated his/her understanding and acceptance.     Dental advisory given  Plan Discussed with: CRNA  Anesthesia Plan Comments: (Patient declined nerve block.)      Anesthesia Quick Evaluation

## 2018-09-02 ENCOUNTER — Ambulatory Visit (HOSPITAL_COMMUNITY)
Admission: RE | Admit: 2018-09-02 | Discharge: 2018-09-02 | Disposition: A | Discharge status: Home / Self Care | Payer: Self-pay | Attending: Orthopedic Surgery | Admitting: Orthopedic Surgery

## 2018-09-02 ENCOUNTER — Ambulatory Visit (HOSPITAL_COMMUNITY): Payer: Self-pay | Admitting: Vascular Surgery

## 2018-09-02 ENCOUNTER — Ambulatory Visit (HOSPITAL_COMMUNITY): Payer: Self-pay

## 2018-09-02 ENCOUNTER — Encounter (HOSPITAL_COMMUNITY): Payer: Self-pay

## 2018-09-02 ENCOUNTER — Other Ambulatory Visit: Payer: Self-pay

## 2018-09-02 ENCOUNTER — Encounter (HOSPITAL_COMMUNITY)
Admission: RE | Disposition: A | Discharge status: Home / Self Care | Payer: Self-pay | Source: Home / Self Care | Attending: Orthopedic Surgery

## 2018-09-02 DIAGNOSIS — T148XXA Other injury of unspecified body region, initial encounter: Secondary | ICD-10-CM

## 2018-09-02 DIAGNOSIS — S93432A Sprain of tibiofibular ligament of left ankle, initial encounter: Secondary | ICD-10-CM | POA: Insufficient documentation

## 2018-09-02 DIAGNOSIS — Z87891 Personal history of nicotine dependence: Secondary | ICD-10-CM | POA: Insufficient documentation

## 2018-09-02 DIAGNOSIS — S8262XA Displaced fracture of lateral malleolus of left fibula, initial encounter for closed fracture: Secondary | ICD-10-CM | POA: Insufficient documentation

## 2018-09-02 DIAGNOSIS — Z419 Encounter for procedure for purposes other than remedying health state, unspecified: Secondary | ICD-10-CM

## 2018-09-02 HISTORY — DX: Other injury of unspecified body region, initial encounter: T14.8XXA

## 2018-09-02 HISTORY — DX: Rider (driver) (passenger) of other motorcycle injured in unspecified traffic accident, initial encounter: V29.99XA

## 2018-09-02 HISTORY — DX: Attention-deficit hyperactivity disorder, unspecified type: F90.9

## 2018-09-02 HISTORY — PX: ORIF ANKLE FRACTURE: SHX5408

## 2018-09-02 LAB — PROTIME-INR
INR: 1 (ref 0.8–1.2)
Prothrombin Time: 13.1 seconds (ref 11.4–15.2)

## 2018-09-02 SURGERY — OPEN REDUCTION INTERNAL FIXATION (ORIF) ANKLE FRACTURE
Anesthesia: General | Site: Ankle | Laterality: Left

## 2018-09-02 MED ORDER — VITAMIN D3 125 MCG (5000 UT) PO TABS: 1.0000 | ORAL_TABLET | Freq: Every day | ORAL | 3 refills | Status: AC

## 2018-09-02 MED ORDER — PHENYLEPHRINE 40 MCG/ML (10ML) SYRINGE FOR IV PUSH (FOR BLOOD PRESSURE SUPPORT)
PREFILLED_SYRINGE | INTRAVENOUS | Status: AC
  Filled 2018-09-02: qty 10

## 2018-09-02 MED ORDER — KETAMINE HCL 10 MG/ML IJ SOLN
INTRAMUSCULAR | Status: DC | PRN
  Administered 2018-09-02: 20 mg via INTRAVENOUS
  Administered 2018-09-02 (×3): 10 mg via INTRAVENOUS

## 2018-09-02 MED ORDER — ONDANSETRON HCL 4 MG/2ML IJ SOLN
INTRAMUSCULAR | Status: DC | PRN
  Administered 2018-09-02: 4 mg via INTRAVENOUS

## 2018-09-02 MED ORDER — BUPIVACAINE LIPOSOME 1.3 % IJ SUSP
20.0000 mL | Freq: Once | INTRAMUSCULAR | Status: DC
  Filled 2018-09-02: qty 20

## 2018-09-02 MED ORDER — OXYCODONE-ACETAMINOPHEN 5-325 MG PO TABS
1.0000 | ORAL_TABLET | Freq: Three times a day (TID) | ORAL | Status: DC | PRN
  Administered 2018-09-02: 1 via ORAL

## 2018-09-02 MED ORDER — ROCURONIUM BROMIDE 100 MG/10ML IV SOLN: INTRAVENOUS | Status: DC | PRN

## 2018-09-02 MED ORDER — ACETAMINOPHEN 500 MG PO TABS
1000.0000 mg | ORAL_TABLET | Freq: Once | ORAL | Status: AC
  Administered 2018-09-02: 1000 mg via ORAL
  Filled 2018-09-02: qty 2

## 2018-09-02 MED ORDER — CEFAZOLIN SODIUM-DEXTROSE 2-4 GM/100ML-% IV SOLN
2.0000 g | INTRAVENOUS | Status: AC
  Administered 2018-09-02: 2 g via INTRAVENOUS
  Filled 2018-09-02: qty 100

## 2018-09-02 MED ORDER — FENTANYL CITRATE (PF) 100 MCG/2ML IJ SOLN
INTRAMUSCULAR | Status: DC | PRN
  Administered 2018-09-02: 100 ug via INTRAVENOUS
  Administered 2018-09-02 (×3): 50 ug via INTRAVENOUS

## 2018-09-02 MED ORDER — CHLORHEXIDINE GLUCONATE 4 % EX LIQD: 60.0000 mL | Freq: Once | CUTANEOUS | Status: DC

## 2018-09-02 MED ORDER — ROCURONIUM BROMIDE 100 MG/10ML IV SOLN
INTRAVENOUS | Status: DC | PRN
  Administered 2018-09-02: 40 mg via INTRAVENOUS
  Administered 2018-09-02: 60 mg via INTRAVENOUS

## 2018-09-02 MED ORDER — FENTANYL CITRATE (PF) 250 MCG/5ML IJ SOLN
INTRAMUSCULAR | Status: AC
  Filled 2018-09-02: qty 5

## 2018-09-02 MED ORDER — BUPIVACAINE LIPOSOME 1.3 % IJ SUSP
INTRAMUSCULAR | Status: DC | PRN
  Administered 2018-09-02: 20 mL

## 2018-09-02 MED ORDER — PROPOFOL 10 MG/ML IV BOLUS
INTRAVENOUS | Status: DC | PRN
  Administered 2018-09-02: 150 mg via INTRAVENOUS
  Administered 2018-09-02: 50 mg via INTRAVENOUS

## 2018-09-02 MED ORDER — LIDOCAINE 2% (20 MG/ML) 5 ML SYRINGE
INTRAMUSCULAR | Status: DC | PRN
  Administered 2018-09-02: 20 mg via INTRAVENOUS
  Administered 2018-09-02: 80 mg via INTRAVENOUS

## 2018-09-02 MED ORDER — PROMETHAZINE HCL 25 MG/ML IJ SOLN: 6.2500 mg | INTRAMUSCULAR | Status: DC | PRN

## 2018-09-02 MED ORDER — LIDOCAINE 2% (20 MG/ML) 5 ML SYRINGE
INTRAMUSCULAR | Status: AC
  Filled 2018-09-02: qty 5

## 2018-09-02 MED ORDER — FENTANYL CITRATE (PF) 100 MCG/2ML IJ SOLN: 25.0000 ug | INTRAMUSCULAR | Status: DC | PRN

## 2018-09-02 MED ORDER — DEXAMETHASONE SODIUM PHOSPHATE 10 MG/ML IJ SOLN
INTRAMUSCULAR | Status: DC | PRN
  Administered 2018-09-02: 10 mg via INTRAVENOUS

## 2018-09-02 MED ORDER — KETAMINE HCL 50 MG/5ML IJ SOSY
PREFILLED_SYRINGE | INTRAMUSCULAR | Status: AC
  Filled 2018-09-02: qty 5

## 2018-09-02 MED ORDER — PHENYLEPHRINE HCL (PRESSORS) 10 MG/ML IV SOLN
INTRAVENOUS | Status: DC | PRN
  Administered 2018-09-02: 40 ug via INTRAVENOUS
  Administered 2018-09-02 (×2): 80 ug via INTRAVENOUS
  Administered 2018-09-02: 120 ug via INTRAVENOUS
  Administered 2018-09-02: 80 ug via INTRAVENOUS

## 2018-09-02 MED ORDER — PROPOFOL 10 MG/ML IV BOLUS
INTRAVENOUS | Status: AC
  Filled 2018-09-02: qty 40

## 2018-09-02 MED ORDER — SODIUM CHLORIDE 0.9 % IV SOLN
INTRAVENOUS | Status: DC | PRN
  Administered 2018-09-02: 120 ug/min via INTRAVENOUS

## 2018-09-02 MED ORDER — MIDAZOLAM HCL 2 MG/2ML IJ SOLN
INTRAMUSCULAR | Status: AC
  Filled 2018-09-02: qty 2

## 2018-09-02 MED ORDER — DEXAMETHASONE SODIUM PHOSPHATE 10 MG/ML IJ SOLN
INTRAMUSCULAR | Status: AC
  Filled 2018-09-02: qty 1

## 2018-09-02 MED ORDER — OXYCODONE-ACETAMINOPHEN 5-325 MG PO TABS
ORAL_TABLET | ORAL | Status: AC
  Filled 2018-09-02: qty 2

## 2018-09-02 MED ORDER — LACTATED RINGERS IV SOLN
INTRAVENOUS | Status: DC | PRN
  Administered 2018-09-02: 07:00:00 via INTRAVENOUS

## 2018-09-02 MED ORDER — ONDANSETRON HCL 4 MG/2ML IJ SOLN
INTRAMUSCULAR | Status: AC
  Filled 2018-09-02: qty 2

## 2018-09-02 MED ORDER — SODIUM CHLORIDE (PF) 0.9 % IJ SOLN
INTRAMUSCULAR | Status: DC | PRN
  Administered 2018-09-02: 20 mL

## 2018-09-02 MED ORDER — 0.9 % SODIUM CHLORIDE (POUR BTL) OPTIME
TOPICAL | Status: DC | PRN
  Administered 2018-09-02: 09:00:00 1000 mL

## 2018-09-02 MED ORDER — ROCURONIUM BROMIDE 10 MG/ML (PF) SYRINGE
PREFILLED_SYRINGE | INTRAVENOUS | Status: AC
  Filled 2018-09-02: qty 10

## 2018-09-02 MED ORDER — MIDAZOLAM HCL 5 MG/5ML IJ SOLN
INTRAMUSCULAR | Status: DC | PRN
  Administered 2018-09-02: 2 mg via INTRAVENOUS

## 2018-09-02 MED ORDER — SUGAMMADEX SODIUM 200 MG/2ML IV SOLN
INTRAVENOUS | Status: DC | PRN
  Administered 2018-09-02: 150 mg via INTRAVENOUS

## 2018-09-02 MED ORDER — KETOROLAC TROMETHAMINE 30 MG/ML IJ SOLN
INTRAMUSCULAR | Status: DC | PRN
  Administered 2018-09-02: 30 mg via INTRAVENOUS

## 2018-09-02 SURGICAL SUPPLY — 81 items
BANDAGE ACE 4X5 VEL STRL LF (GAUZE/BANDAGES/DRESSINGS) ×2 IMPLANT
BANDAGE ACE 6X5 VEL STRL LF (GAUZE/BANDAGES/DRESSINGS) ×2 IMPLANT
BANDAGE ESMARK 6X9 LF (GAUZE/BANDAGES/DRESSINGS) ×1 IMPLANT
BIT DRILL 2.0 (BIT) ×2
BIT DRILL 2.0MM (BIT) ×1
BIT DRILL 2.5X2.75 QC CALB (BIT) ×2 IMPLANT
BIT DRILL 2XNS DISP SS SM FRAG (BIT) IMPLANT
BIT DRILL 3.5X5.5 QC CALB (BIT) ×2 IMPLANT
BIT DRL 2XNS DISP SS SM FRAG (BIT) ×1
BNDG CMPR 9X6 STRL LF SNTH (GAUZE/BANDAGES/DRESSINGS)
BNDG ESMARK 6X9 LF (GAUZE/BANDAGES/DRESSINGS)
BNDG GAUZE ELAST 4 BULKY (GAUZE/BANDAGES/DRESSINGS) ×6 IMPLANT
BRUSH SCRUB SURG 4.25 DISP (MISCELLANEOUS) ×6 IMPLANT
COVER MAYO STAND STRL (DRAPES) ×3 IMPLANT
COVER SURGICAL LIGHT HANDLE (MISCELLANEOUS) ×3 IMPLANT
COVER WAND RF STERILE (DRAPES) ×1 IMPLANT
DRAPE C-ARM 42X72 X-RAY (DRAPES) ×3 IMPLANT
DRAPE C-ARMOR (DRAPES) ×3 IMPLANT
DRAPE HALF SHEET 40X57 (DRAPES) ×3 IMPLANT
DRAPE U-SHAPE 47X51 STRL (DRAPES) ×3 IMPLANT
DRSG ADAPTIC 3X8 NADH LF (GAUZE/BANDAGES/DRESSINGS) ×2 IMPLANT
DRSG EMULSION OIL 3X3 NADH (GAUZE/BANDAGES/DRESSINGS) IMPLANT
DRSG PAD ABDOMINAL 8X10 ST (GAUZE/BANDAGES/DRESSINGS) ×2 IMPLANT
ELECT REM PT RETURN 9FT ADLT (ELECTROSURGICAL) ×3
ELECTRODE REM PT RTRN 9FT ADLT (ELECTROSURGICAL) ×1 IMPLANT
GAUZE SPONGE 4X4 12PLY STRL (GAUZE/BANDAGES/DRESSINGS) ×4 IMPLANT
GLOVE BIO SURGEON STRL SZ7.5 (GLOVE) ×3 IMPLANT
GLOVE BIO SURGEON STRL SZ8 (GLOVE) ×3 IMPLANT
GLOVE BIOGEL PI IND STRL 7.5 (GLOVE) ×1 IMPLANT
GLOVE BIOGEL PI IND STRL 8 (GLOVE) ×1 IMPLANT
GLOVE BIOGEL PI INDICATOR 7.5 (GLOVE) ×2
GLOVE BIOGEL PI INDICATOR 8 (GLOVE) ×2
GOWN STRL REUS W/ TWL LRG LVL3 (GOWN DISPOSABLE) ×2 IMPLANT
GOWN STRL REUS W/ TWL XL LVL3 (GOWN DISPOSABLE) ×1 IMPLANT
GOWN STRL REUS W/TWL LRG LVL3 (GOWN DISPOSABLE) ×6
GOWN STRL REUS W/TWL XL LVL3 (GOWN DISPOSABLE) ×3
KIT BASIN OR (CUSTOM PROCEDURE TRAY) ×3 IMPLANT
KIT INFUSE SMALL (Orthopedic Implant) ×2 IMPLANT
KIT TURNOVER KIT B (KITS) ×3 IMPLANT
MANIFOLD NEPTUNE II (INSTRUMENTS) ×1 IMPLANT
NDL HYPO 21X1.5 SAFETY (NEEDLE) IMPLANT
NEEDLE HYPO 21X1.5 SAFETY (NEEDLE) IMPLANT
NS IRRIG 1000ML POUR BTL (IV SOLUTION) ×3 IMPLANT
PACK GENERAL/GYN (CUSTOM PROCEDURE TRAY) ×1 IMPLANT
PACK ORTHO EXTREMITY (CUSTOM PROCEDURE TRAY) ×3 IMPLANT
PAD ARMBOARD 7.5X6 YLW CONV (MISCELLANEOUS) ×6 IMPLANT
PAD CAST 4YDX4 CTTN HI CHSV (CAST SUPPLIES) IMPLANT
PADDING CAST COTTON 4X4 STRL (CAST SUPPLIES) ×3
PADDING CAST COTTON 6X4 STRL (CAST SUPPLIES) ×2 IMPLANT
PLATE ACE 3.5MM 12HOLE (Plate) ×2 IMPLANT
SCREW CORTICAL 2.7MM  14MM (Screw) ×2 IMPLANT
SCREW CORTICAL 2.7MM 14MM (Screw) IMPLANT
SCREW CORTICAL 3.5MM  16MM (Screw) ×2 IMPLANT
SCREW CORTICAL 3.5MM  20MM (Screw) ×2 IMPLANT
SCREW CORTICAL 3.5MM  44MM (Screw) ×4 IMPLANT
SCREW CORTICAL 3.5MM 14MM (Screw) ×8 IMPLANT
SCREW CORTICAL 3.5MM 16MM (Screw) IMPLANT
SCREW CORTICAL 3.5MM 18MM (Screw) ×2 IMPLANT
SCREW CORTICAL 3.5MM 20MM (Screw) IMPLANT
SCREW CORTICAL 3.5MM 44MM (Screw) IMPLANT
SPLINT PLASTER CAST XFAST 5X30 (CAST SUPPLIES) IMPLANT
SPLINT PLASTER XFAST SET 5X30 (CAST SUPPLIES) ×2
SPONGE LAP 18X18 RF (DISPOSABLE) ×3 IMPLANT
STAPLER VISISTAT 35W (STAPLE) IMPLANT
SUCTION FRAZIER HANDLE 10FR (MISCELLANEOUS) ×2
SUCTION TUBE FRAZIER 10FR DISP (MISCELLANEOUS) ×1 IMPLANT
SUT ETHILON 2 0 PSLX (SUTURE) ×2 IMPLANT
SUT ETHILON 3 0 PS 1 (SUTURE) ×6 IMPLANT
SUT PDS AB 2-0 CT1 27 (SUTURE) IMPLANT
SUT VIC AB 0 CTX 36 (SUTURE) ×3
SUT VIC AB 0 CTX36XBRD ANTBCTR (SUTURE) IMPLANT
SUT VIC AB 1 CTX 36 (SUTURE) ×3
SUT VIC AB 1 CTX36XBRD ANBCTR (SUTURE) IMPLANT
SUT VIC AB 2-0 CT1 27 (SUTURE) ×6
SUT VIC AB 2-0 CT1 TAPERPNT 27 (SUTURE) ×2 IMPLANT
TOWEL OR 17X24 6PK STRL BLUE (TOWEL DISPOSABLE) ×3 IMPLANT
TOWEL OR 17X26 10 PK STRL BLUE (TOWEL DISPOSABLE) ×6 IMPLANT
TUBE CONNECTING 12'X1/4 (SUCTIONS) ×1
TUBE CONNECTING 12X1/4 (SUCTIONS) ×2 IMPLANT
UNDERPAD 30X30 (UNDERPADS AND DIAPERS) ×3 IMPLANT
WATER STERILE IRR 1000ML POUR (IV SOLUTION) ×1 IMPLANT

## 2018-09-02 NOTE — Anesthesia Procedure Notes (Signed)
Procedure Name: Intubation Date/Time: 09/02/2018 8:23 AM Performed by: Fransisca Kaufmann, CRNA Pre-anesthesia Checklist: Patient identified, Emergency Drugs available, Suction available and Patient being monitored Patient Re-evaluated:Patient Re-evaluated prior to induction Oxygen Delivery Method: Circle System Utilized Preoxygenation: Pre-oxygenation with 100% oxygen Induction Type: IV induction Ventilation: Mask ventilation without difficulty Laryngoscope Size: Miller and 2 Grade View: Grade I Tube type: Oral Tube size: 7.5 mm Number of attempts: 1 Airway Equipment and Method: Stylet and Oral airway Placement Confirmation: ETT inserted through vocal cords under direct vision,  positive ETCO2 and breath sounds checked- equal and bilateral Secured at: 23 cm Tube secured with: Tape Dental Injury: Teeth and Oropharynx as per pre-operative assessment

## 2018-09-02 NOTE — H&P (Signed)
H&P documentation: 08/22/2018  -History and Physical Reviewed  -Patient has been re-examined  -No change in the plan of care  I discussed with the patient the risks and benefits of surgery for left ankle repair, including the possibility of infection, nerve injury, vessel injury, wound breakdown, arthritis, symptomatic hardware, DVT/ PE, loss of motion, malunion, nonunion, and need for further surgery among others.  We also specifically discussed the the potential for syndesmotic fixation which may require later removal.  He acknowledged these risks and wished to proceed.    Budd Palmer

## 2018-09-02 NOTE — Anesthesia Postprocedure Evaluation (Signed)
Anesthesia Post Note  Patient: Douglas Wyatt  Procedure(s) Performed: OPEN REDUCTION INTERNAL FIXATION (ORIF) LEFT ANKLE FRACTURE, REPAIR OF LEFT ANKLE SYNDESMOSIS (Left Ankle)     Patient location during evaluation: PACU Anesthesia Type: General Level of consciousness: awake and alert Pain management: pain level controlled Vital Signs Assessment: post-procedure vital signs reviewed and stable Respiratory status: spontaneous breathing, nonlabored ventilation and respiratory function stable Cardiovascular status: blood pressure returned to baseline and stable Postop Assessment: no apparent nausea or vomiting Anesthetic complications: no    Last Vitals:  Vitals:   09/02/18 1110 09/02/18 1115  BP: (!) 145/95   Pulse: 88 84  Resp: 16 (!) 22  Temp:  36.5 C  SpO2: 98% 98%    Last Pain:  Vitals:   09/02/18 1115  TempSrc:   PainSc: 4                  Cecile Hearing

## 2018-09-02 NOTE — Brief Op Note (Signed)
09/02/2018  10:37 AM  PATIENT:  Neta Mends  43 y.o. male  PRE-OPERATIVE DIAGNOSIS:   1. LEFT BIMALLEOLAR EQUIVALENT ANKLE FRACTURE 2. LEFT ANKLE SYNDESMOTIC DISRUPTION  POST-OPERATIVE DIAGNOSIS:   1. LEFT BIMALLEOLAR EQUIVALENT ANKLE FRACTURE 2. LEFT ANKLE SYNDESMOTIC DISRUPTION  PROCEDURE:  Procedure(s): 1. OPEN REDUCTION INTERNAL FIXATION (ORIF) LEFT ANKLE LATERAL MALLEOLUS FRACTURE 2. OPEN REDUCTION INTERNAL FIXATION (ORIF) LEFT ANKLE SYNDESMOSIS (Left)  SURGEON:  Surgeon(s) and Role:    Myrene Galas, MD - Primary  PHYSICIAN ASSISTANT: Montez Morita, PA-C  ANESTHESIA:   general  EBL:  MINIMAL  BLOOD ADMINISTERED:none  DRAINS: none   LOCAL MEDICATIONS USED:  OTHER EXPAREL  SPECIMEN:  No Specimen  DISPOSITION OF SPECIMEN:  N/A  COUNTS:  YES  TOURNIQUET:  * No tourniquets in log *  DICTATION: Written.  PLAN OF CARE: Discharge to home after PACU  PATIENT DISPOSITION:  PACU - hemodynamically stable.   Delay start of Pharmacological VTE agent (>24hrs) due to surgical blood loss or risk of bleeding: no

## 2018-09-02 NOTE — Discharge Instructions (Addendum)
[facility] Discharge Instructions   General Discharge Instructions  WEIGHT BEARING STATUS: Nonweightbearing Left leg (NWB)  RANGE OF MOTION/ACTIVITY: ok to move toes and knee on left leg. Activity as tolerated while maintaining Nonweightbearing on Left leg. Ok to use knee scooter   Bone health:  Your vitamin D levels are in the deficient range. Recommend taking vitamin d 3 5000 IU daily and vitamin c 500 mg daily   Wound Care: do not get splint wet, do not remove splint. We will remove splint at office follow up. Bring black CAM boot with you to your first appointment   DVT/PE prophylaxis: take remainder of Lovenox injections that you have. One injection daily until you are out of injections. This is to prevent blood clots   Diet: as you were eating previously.  Can use over the counter stool softeners and bowel preparations, such as Miralax, to help with bowel movements.  Narcotics can be constipating.  Be sure to drink plenty of fluids  PAIN MEDICATION USE AND EXPECTATIONS  You have likely been given narcotic medications to help control your pain.  After a traumatic event that results in an fracture (broken bone) with or without surgery, it is ok to use narcotic pain medications to help control one's pain.  We understand that everyone responds to pain differently and each individual patient will be evaluated on a regular basis for the continued need for narcotic medications. Ideally, narcotic medication use should last no more than 6-8 weeks (coinciding with fracture healing).   As a patient it is your responsibility as well to monitor narcotic medication use and report the amount and frequency you use these medications when you come to your office visit.   We would also advise that if you are using narcotic medications, you should take a dose prior to therapy to maximize you participation.  IF YOU ARE ON NARCOTIC MEDICATIONS IT IS NOT PERMISSIBLE TO OPERATE A MOTOR  VEHICLE (MOTORCYCLE/CAR/TRUCK/MOPED) OR HEAVY MACHINERY DO NOT MIX NARCOTICS WITH OTHER CNS (CENTRAL NERVOUS SYSTEM) DEPRESSANTS SUCH AS ALCOHOL   STOP SMOKING OR USING NICOTINE PRODUCTS!!!!  As discussed nicotine severely impairs your body's ability to heal surgical and traumatic wounds but also impairs bone healing.  Wounds and bone heal by forming microscopic blood vessels (angiogenesis) and nicotine is a vasoconstrictor (essentially, shrinks blood vessels).  Therefore, if vasoconstriction occurs to these microscopic blood vessels they essentially disappear and are unable to deliver necessary nutrients to the healing tissue.  This is one modifiable factor that you can do to dramatically increase your chances of healing your injury.    (This means no smoking, no nicotine gum, patches, etc)  DO NOT USE NONSTEROIDAL ANTI-INFLAMMATORY DRUGS (NSAID'S)  Using products such as Advil (ibuprofen), Aleve (naproxen), Motrin (ibuprofen) for additional pain control during fracture healing can delay and/or prevent the healing response.  If you would like to take over the counter (OTC) medication, Tylenol (acetaminophen) is ok.  However, some narcotic medications that are given for pain control contain acetaminophen as well. Therefore, you should not exceed more than 4000 mg of tylenol in a day if you do not have liver disease.  Also note that there are may OTC medicines, such as cold medicines and allergy medicines that my contain tylenol as well.  If you have any questions about medications and/or interactions please ask your doctor/PA or your pharmacist.      ICE AND ELEVATE INJURED/OPERATIVE EXTREMITY  Using ice and elevating the injured extremity  above your heart can help with swelling and pain control.  Icing in a pulsatile fashion, such as 20 minutes on and 20 minutes off, can be followed.    Do not place ice directly on skin. Make sure there is a barrier between to skin and the ice pack.    Using frozen  items such as frozen peas works well as the conform nicely to the are that needs to be iced.  USE AN ACE WRAP OR TED HOSE FOR SWELLING CONTROL  In addition to icing and elevation, Ace wraps or TED hose are used to help limit and resolve swelling.  It is recommended to use Ace wraps or TED hose until you are informed to stop.    When using Ace Wraps start the wrapping distally (farthest away from the body) and wrap proximally (closer to the body)   Example: If you had surgery on your leg or thing and you do not have a splint on, start the ace wrap at the toes and work your way up to the thigh        If you had surgery on your upper extremity and do not have a splint on, start the ace wrap at your fingers and work your way up to the upper arm  IF YOU ARE IN A SPLINT OR CAST DO NOT REMOVE IT FOR ANY REASON   If your splint gets wet for any reason please contact the office immediately. You may shower in your splint or cast as long as you keep it dry.  This can be done by wrapping in a cast cover or garbage back (or similar)  Do Not stick any thing down your splint or cast such as pencils, money, or hangers to try and scratch yourself with.  If you feel itchy take benadryl as prescribed on the bottle for itching  IF YOU ARE IN A CAM BOOT (BLACK BOOT)  You may remove boot periodically. Perform daily dressing changes as noted below.  Wash the liner of the boot regularly and wear a sock when wearing the boot. It is recommended that you sleep in the boot until told otherwise  CALL THE OFFICE WITH ANY QUESTIONS OR CONCERNS: [phone]    Cast or Splint Care, Adult Casts and splints are supports that are worn to protect broken bones and other injuries. A cast or splint may hold a bone still and in the correct position while it heals. Casts and splints may also help ease pain, swelling, and muscle spasms. A cast is a hardened support that is usually made of fiberglass or plaster. It is custom-fit to the  body and it offers more protection than a splint. It cannot be taken off and put back on. A splint is a type of soft support that is usually made from cloth and elastic. It can be adjusted or taken off as needed. You may need a cast or a splint if you:  Have a broken bone.  Have a soft-tissue injury.  Need to keep an injured body part from moving (keep it immobile) after surgery. How is this treated? If you have a cast:   Do not stick anything inside the cast to scratch your skin. Sticking something in the cast increases your risk of infection.  Check the skin around the cast every day. Tell your health care provider about any concerns.  You may put lotion on dry skin around the edges of the cast. Do not put lotion on the skin  underneath the cast.  Keep the cast clean.  If the cast is not waterproof: ? Do not let it get wet. ? Cover it with a watertight covering when you take a bath or a shower. If you have a splint:   Wear it as told by your health care provider. Remove it only as told by your health care provider.  Loosen the splint if your fingers or toes tingle, become numb, or turn cold and blue.  Keep the splint clean.  If the splint is not waterproof: ? Do not let it get wet. ? Cover it with a watertight covering when you take a bath or a shower. Bathing  Do not take baths or swim until your health care provider approves. Ask your health care provider if you can take showers. You may only be allowed to take sponge baths for bathing.  If your cast or splint is not waterproof, cover it with a watertight covering when you take a bath or shower. Managing pain, stiffness, and swelling  Move your fingers or toes often to avoid stiffness and to lessen swelling.  Raise (elevate) the injured area above the level of your heart while sitting or lying down. Safety  Do not use the injured limb to support your body weight until your health care provider says that it is  okay.  Use crutches or other assistive devices as told by your health care provider. General instructions  Do not put pressure on any part of the cast or splint until it is fully hardened. This may take several hours.  Return to your normal activities as told by your health care provider. Ask your health care provider what activities are safe for you.  Take over-the-counter and prescription medicines only as told by your health care provider.  Keep all follow-up visits as told by your health care provider. This is important. Contact a health care provider if:  Your cast or splint gets damaged.  The skin around the cast gets red or raw.  The skin under the cast is extremely itchy or painful.  Your cast or splint feels very uncomfortable.  Your cast or splint is too tight or too loose.  Your cast becomes wet or it develops a soft spot or area.  You get an object stuck under your cast. Get help right away if:  Your pain is getting worse.  The injured area tingles, becomes numb, or turns cold and blue.  The part of your body above or below the cast is swollen and discolored.  You cannot feel or move your fingers or toes.  There is fluid leaking through the cast.  You have severe pain or pressure under the cast.  You have trouble breathing.  You have shortness of breath.  You have chest pain. This information is not intended to replace advice given to you by your health care provider. Make sure you discuss any questions you have with your health care provider. Document Released: 04/04/2000 Document Revised: 10/27/2015 Document Reviewed: 09/29/2015 Elsevier Interactive Patient Education  2019 [employer].

## 2018-09-02 NOTE — Progress Notes (Signed)
Orthopedic Tech Progress Note Patient Details:  Douglas Wyatt 1975/06/27 [id number] PACU RN called requesting CAM WALKER. But the patient is not wearing it until he goes to the DR once wrap and bandages are removed.    Post Interventions Patient Tolerated: Other (comment) Instructions Provided: Other (comment)    Ortho Devices Type of Ortho Device: CAM walker Ortho Device/Splint Location: LLE Ortho Device/Splint Interventions: Other (comment)   Post Interventions Patient Tolerated: Other (comment) Instructions Provided: Other (comment)   [name] 09/02/2018, 11:28 AM

## 2018-09-02 NOTE — Transfer of Care (Signed)
Immediate Anesthesia Transfer of Care Note  Patient: Douglas Wyatt  Procedure(s) Performed: OPEN REDUCTION INTERNAL FIXATION (ORIF) LEFT ANKLE FRACTURE, REPAIR OF LEFT ANKLE SYNDESMOSIS (Left Ankle)  Patient Location: PACU  Anesthesia Type:General  Level of Consciousness: awake, alert , oriented and sedated  Airway & Oxygen Therapy: Patient Spontanous Breathing and Patient connected to nasal cannula oxygen  Post-op Assessment: Report given to RN, Post -op Vital signs reviewed and stable and Patient moving all extremities  Post vital signs: Reviewed and stable  Last Vitals:  Vitals Value Taken Time  BP 143/83 09/02/2018 10:53 AM  Temp    Pulse 91 09/02/2018 10:55 AM  Resp 14 09/02/2018 10:55 AM  SpO2 99 % 09/02/2018 10:55 AM  Vitals shown include unvalidated device data.  Last Pain:  Vitals:   09/02/18 0634  TempSrc:   PainSc: 5       Patients Stated Pain Goal: 2 (09/02/18 7902)  Complications: No apparent anesthesia complications

## 2018-09-03 ENCOUNTER — Encounter (HOSPITAL_COMMUNITY): Payer: Self-pay | Admitting: Orthopedic Surgery

## 2018-09-08 ENCOUNTER — Encounter (HOSPITAL_COMMUNITY): Payer: Self-pay | Admitting: Orthopedic Surgery

## 2018-09-16 NOTE — Op Note (Signed)
09/02/2018  10:37 AM  PATIENT:  Douglas Wyatt  43 y.o. male  PRE-OPERATIVE DIAGNOSIS:   1. LEFT BIMALLEOLAR EQUIVALENT ANKLE FRACTURE 2. LEFT ANKLE SYNDESMOTIC DISRUPTION  POST-OPERATIVE DIAGNOSIS:   1. LEFT BIMALLEOLAR EQUIVALENT ANKLE FRACTURE 2. LEFT ANKLE SYNDESMOTIC DISRUPTION  PROCEDURE:  Procedure(s): 1. OPEN REDUCTION INTERNAL FIXATION (ORIF) LEFT ANKLE LATERAL MALLEOLUS FRACTURE 2. OPEN REDUCTION INTERNAL FIXATION (ORIF) LEFT ANKLE SYNDESMOSIS (Left)  SURGEON:  Surgeon(s) and Role:    Myrene Galas* Shervon Kerwin, MD - Primary  PHYSICIAN ASSISTANT: Montez MoritaKEITH PAUL, PA-C  ANESTHESIA:   general  EBL:  MINIMAL  BLOOD ADMINISTERED:none  DRAINS: none   LOCAL MEDICATIONS USED:  OTHER EXPAREL  SPECIMEN:  No Specimen  DISPOSITION OF SPECIMEN:  N/A  COUNTS:  YES  TOURNIQUET:  * No tourniquets in log *  DICTATION: Written.  PLAN OF CARE: Discharge to home after PACU  PATIENT DISPOSITION:  PACU - hemodynamically stable.   Delay start of Pharmacological VTE agent (>24hrs) due to surgical blood loss or risk of bleeding: no  BRIEF SUMMARY AND INDICATIONS FOR PROCEDURE:  The patient is a [age] y.o. who sustained a fracture dislocation of the ankle, treated with reduction on the floor after an initial effort in the Emergency Department, followed by splint application. Patient subsequently seen in the office to reassess soft tissues, which now show sufficient swelling resolution to allow for surgical repair.  I discussed with the patient the risks and benefits of surgery including the  possibility of infection, DVT, PE, nerve injury, vessel injury, loss of motion, arthritis, symptomatic hardware, heart attack, stroke and need for further surgery, among others. We specifically discussed syndesmotic repair and that some of the implants used for this would likely require subsequent removal. After acknowledging these risks, consent was provided to proceed.  SUMMARY OF PROCEDURE:  The patient was  taken to the operating room after administration of a preoperative antibiotics.  The left lower extremity was prepped and draped in the usual sterile fashion.  A tourniquet was placed about the thigh but never inflated during the procedure.  A timeout was held, and then an incision was made directly over the lateral malleolus with careful dissection to avoid injury to the superficial peroneal nerve.  The periosteum was left intact as we continued deep dissection.  The fracture site was identified and curettage and lavage used to remove hematoma.  With the assistance of distal manipulation and placement of tenaculums, we were able to obtain an anatomic reduction with interdigitation of the primary fracture fragments.  Because of the angle and comminution of the fracture site, I was unable to place a lag screw but was able to hold this interdigitated during application of a lateral malleolus plate.  We used the Paragon system and placed standard fixation in the shaft and distal lateral malleolus, confirming plate position with x-ray and then continuing with a standard fixation as well as a locked fixation.  Final images showed appropriate reductional replacement, trajectory, and length.  Injury films had demonstrated instability of the syndesmosis, so that stabilization and repair were indicated. A small stab incision was made anteromedially and the pointed tenaculum used to apply pressure for reduction from head of most distal screw in the plate. Two tricortical screws were then placed with 15 degrees of anteversion.   Montez MoritaKeith Paul, PA-C, was present and assisting throughout helping me with reduction, instrumentation, and closure.  PROGNOSIS: The patient will be nonweightbearing in the splint with ice and elevation over the next 3 to 5  days.  We will plan to see her back in the office in 10-14 days for removal of sutures and transition to a Cam boot with unrestricted range of motion of the  ankle at that time.   Weightbearing at 6 weeks.  Myrene Galas, MD [facility], [facility] [phone]

## 2019-02-03 ENCOUNTER — Other Ambulatory Visit: Payer: Self-pay

## 2019-02-03 ENCOUNTER — Emergency Department: Payer: Self-pay

## 2019-02-03 ENCOUNTER — Emergency Department
Admission: EM | Admit: 2019-02-03 | Discharge: 2019-02-03 | Disposition: A | Discharge status: Home / Self Care | Payer: Self-pay | Attending: Emergency Medicine | Admitting: Emergency Medicine

## 2019-02-03 DIAGNOSIS — Y929 Unspecified place or not applicable: Secondary | ICD-10-CM | POA: Insufficient documentation

## 2019-02-03 DIAGNOSIS — W312XXA Contact with powered woodworking and forming machines, initial encounter: Secondary | ICD-10-CM | POA: Insufficient documentation

## 2019-02-03 DIAGNOSIS — Z87891 Personal history of nicotine dependence: Secondary | ICD-10-CM | POA: Insufficient documentation

## 2019-02-03 DIAGNOSIS — Y9389 Activity, other specified: Secondary | ICD-10-CM | POA: Insufficient documentation

## 2019-02-03 DIAGNOSIS — Z79899 Other long term (current) drug therapy: Secondary | ICD-10-CM | POA: Insufficient documentation

## 2019-02-03 DIAGNOSIS — Y999 Unspecified external cause status: Secondary | ICD-10-CM | POA: Insufficient documentation

## 2019-02-03 DIAGNOSIS — S61211A Laceration without foreign body of left index finger without damage to nail, initial encounter: Secondary | ICD-10-CM | POA: Insufficient documentation

## 2019-02-03 DIAGNOSIS — R030 Elevated blood-pressure reading, without diagnosis of hypertension: Secondary | ICD-10-CM | POA: Insufficient documentation

## 2019-02-03 MED ORDER — LIDOCAINE HCL (PF) 1 % IJ SOLN
INTRAMUSCULAR | Status: AC
  Administered 2019-02-03: 12:00:00 5 mL
  Filled 2019-02-03: qty 5

## 2019-02-03 MED ORDER — LIDOCAINE HCL (PF) 1 % IJ SOLN
5.0000 mL | Freq: Once | INTRAMUSCULAR | Status: AC
  Administered 2019-02-03: 12:00:00 5 mL

## 2019-02-03 MED ORDER — BACITRACIN-NEOMYCIN-POLYMYXIN 400-5-5000 EX OINT
TOPICAL_OINTMENT | Freq: Once | CUTANEOUS | Status: AC
  Administered 2019-02-03: 1 via TOPICAL
  Filled 2019-02-03: qty 1

## 2019-02-03 MED ORDER — SULFAMETHOXAZOLE-TRIMETHOPRIM 800-160 MG PO TABS
1.0000 | ORAL_TABLET | Freq: Once | ORAL | Status: AC
  Administered 2019-02-03: 13:00:00 1 via ORAL
  Filled 2019-02-03: qty 1

## 2019-02-03 MED ORDER — TRAMADOL HCL 50 MG PO TABS: 50.0000 mg | ORAL_TABLET | Freq: Four times a day (QID) | ORAL | 0 refills | Status: AC | PRN

## 2019-02-03 NOTE — ED Notes (Signed)
Sterile dressing and splint applied to left index finger.

## 2019-02-03 NOTE — ED Notes (Signed)
See triage note  Laceration noted to left index finger  States he was using a table saw

## 2019-02-03 NOTE — ED Triage Notes (Signed)
Pt states he cut his left pointer finger with a table saw this morning. finger  Intact , bledding controlled, saline gauze applied
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
## 2019-09-06 IMAGING — CR LEFT ANKLE COMPLETE - 3+ VIEW
3 series · 3 of 3 positions shown · non-contrast
Comparison: 08/23/2018

CLINICAL DATA: Status post ORIF of left ankle fracture

EXAM:
LEFT ANKLE COMPLETE - 3+ VIEW

[AP]
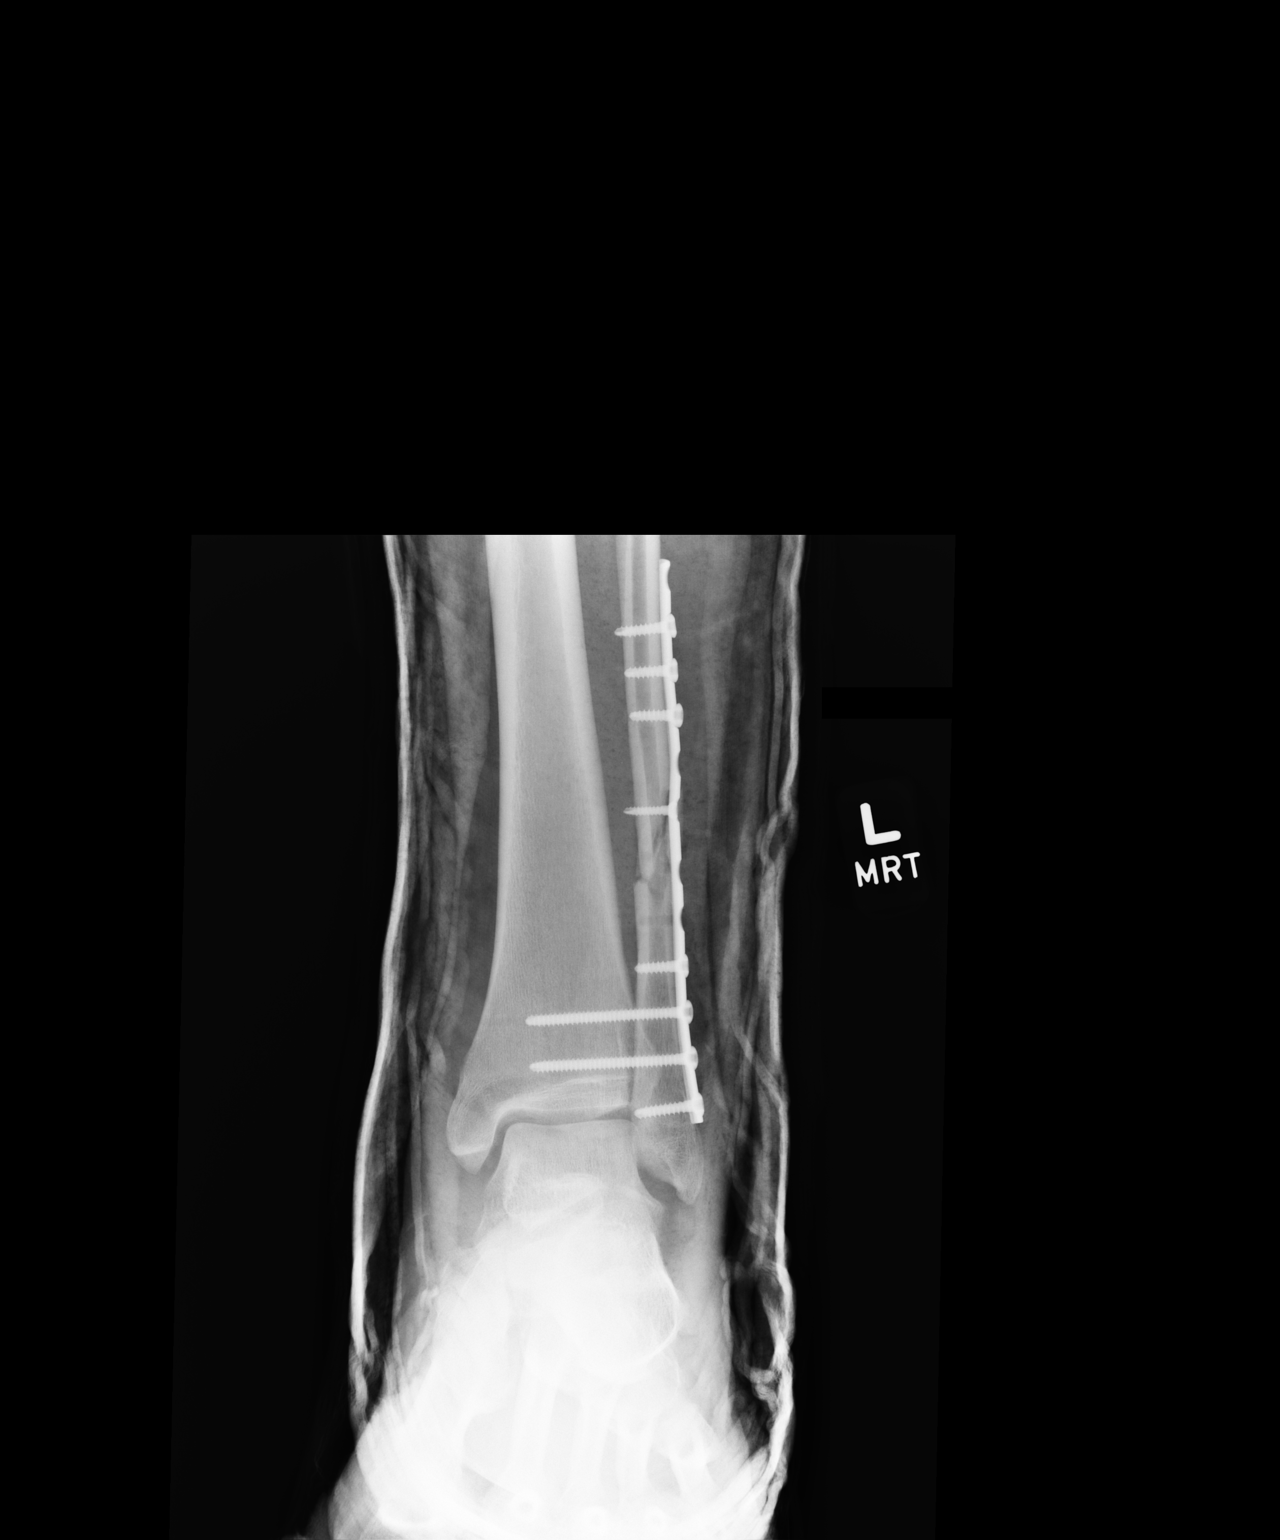

[ap obl int rot]
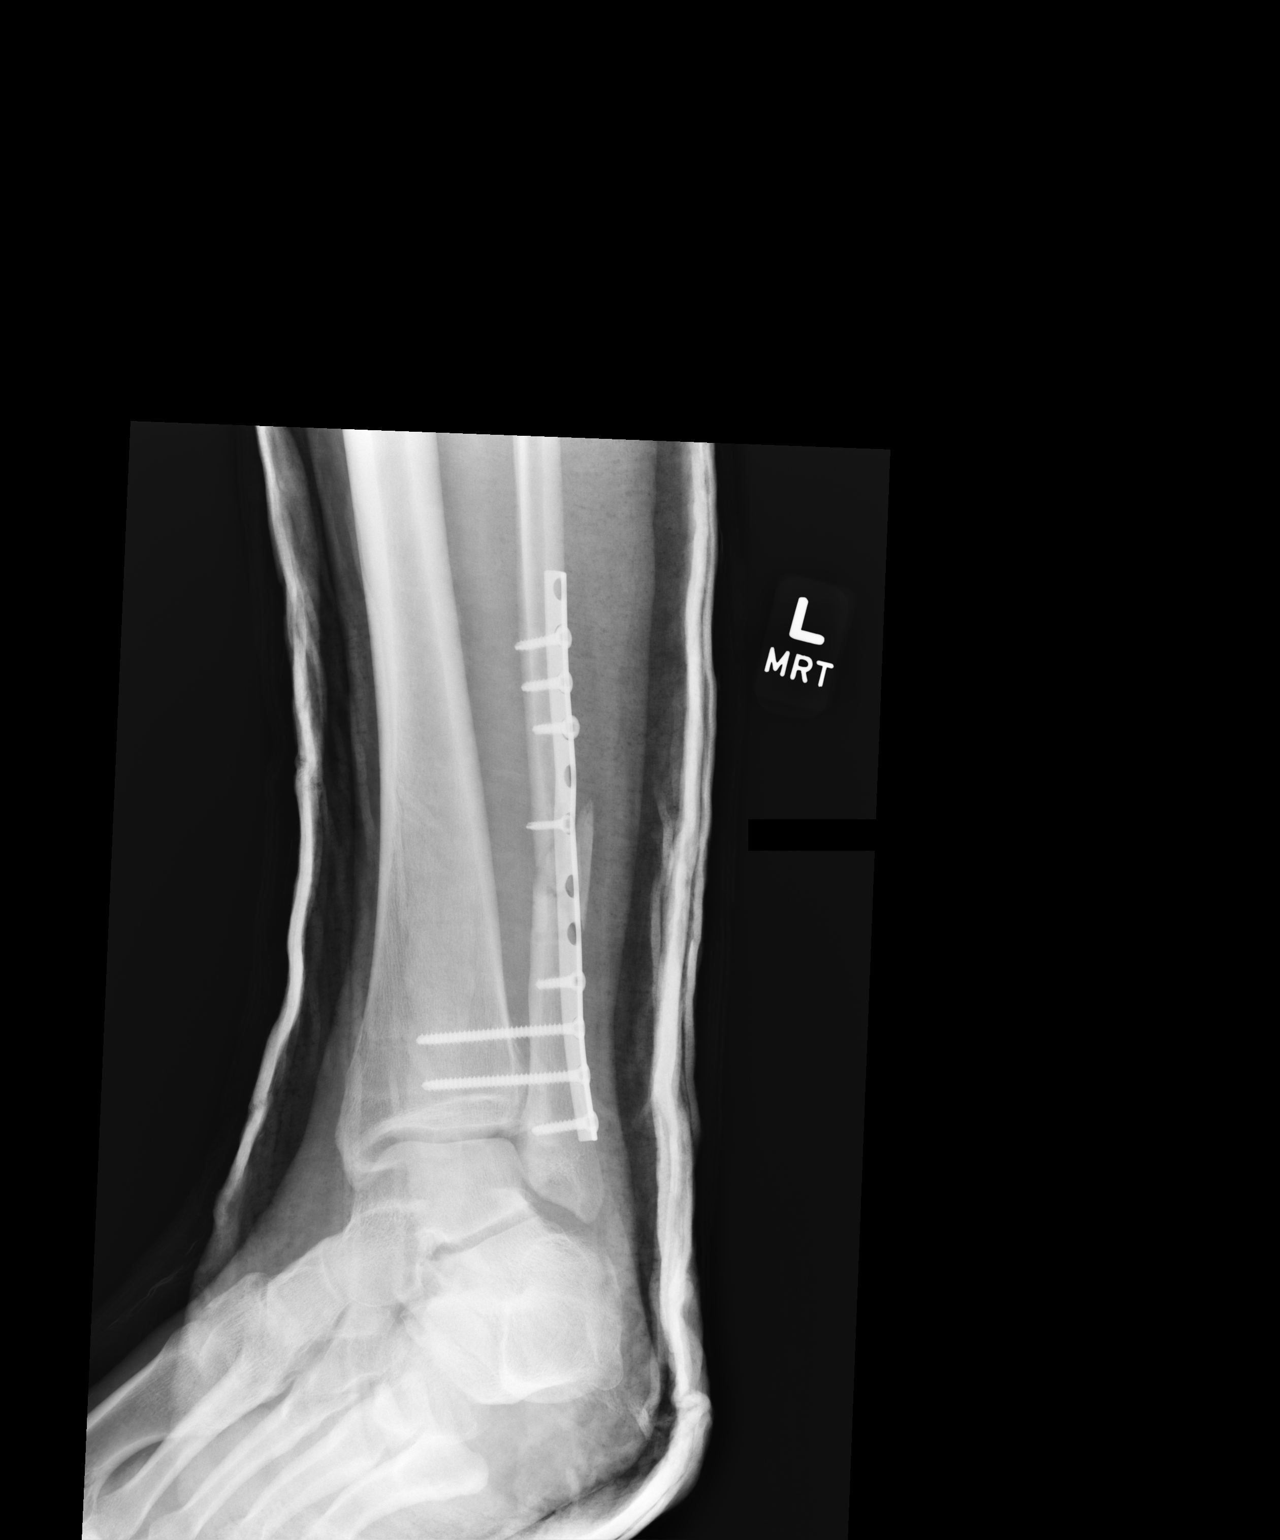

[lateral]
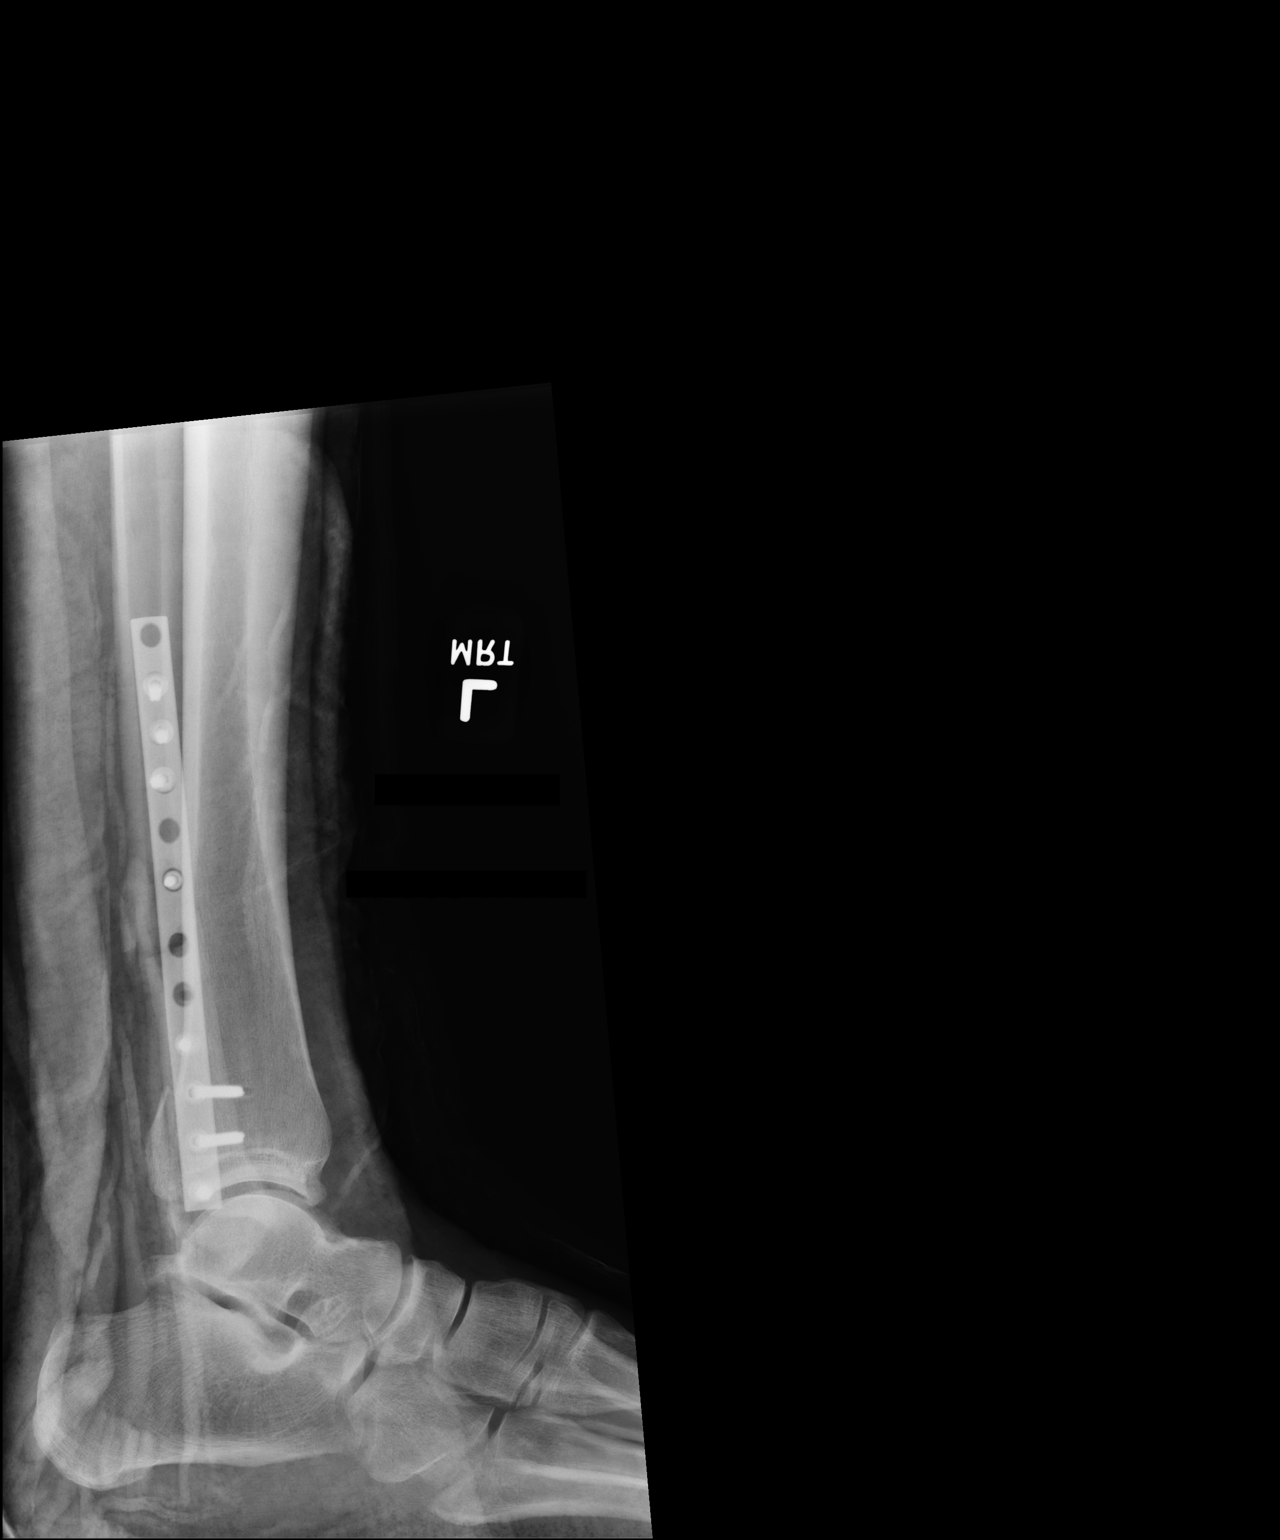

[3 of 3 positions shown; findings below may reference images not displayed]

FINDINGS: Fixation sideplate is noted along the distal fibula. Fracture
fragments are in near anatomic alignment. Two of the fixation screws
extend into the tibia. No other focal abnormality is noted.
IMPRESSION: Status post ORIF of distal fibular fracture.
